# Patient Record
Sex: Male | Born: 1965 | Race: White | Hispanic: No | Marital: Single
Health system: Southern US, Community
[De-identification: ages and names within clinical notes are randomized; demographics above are authoritative.]

---

## 2007-05-14 ENCOUNTER — Other Ambulatory Visit: Payer: Self-pay

## 2007-05-14 ENCOUNTER — Emergency Department: Payer: Self-pay | Admitting: Emergency Medicine

## 2009-03-23 IMAGING — CR DG CHEST 2V
1 series · 2 of 2 positions shown · non-contrast
Comparison: none

REASON FOR EXAM: difficulty breathing
COMMENTS:

PROCEDURE:     DXR - DXR CHEST PA (OR AP) AND LATERAL  - May 14, 2007  [DATE]
RESULT:     The lung fields are clear. The heart, mediastinal and osseous
structures show no significant abnormalities.

[Series 1: view not recorded · 0.17mm/px · 2 of 2 slices shown]
[im 1/2]
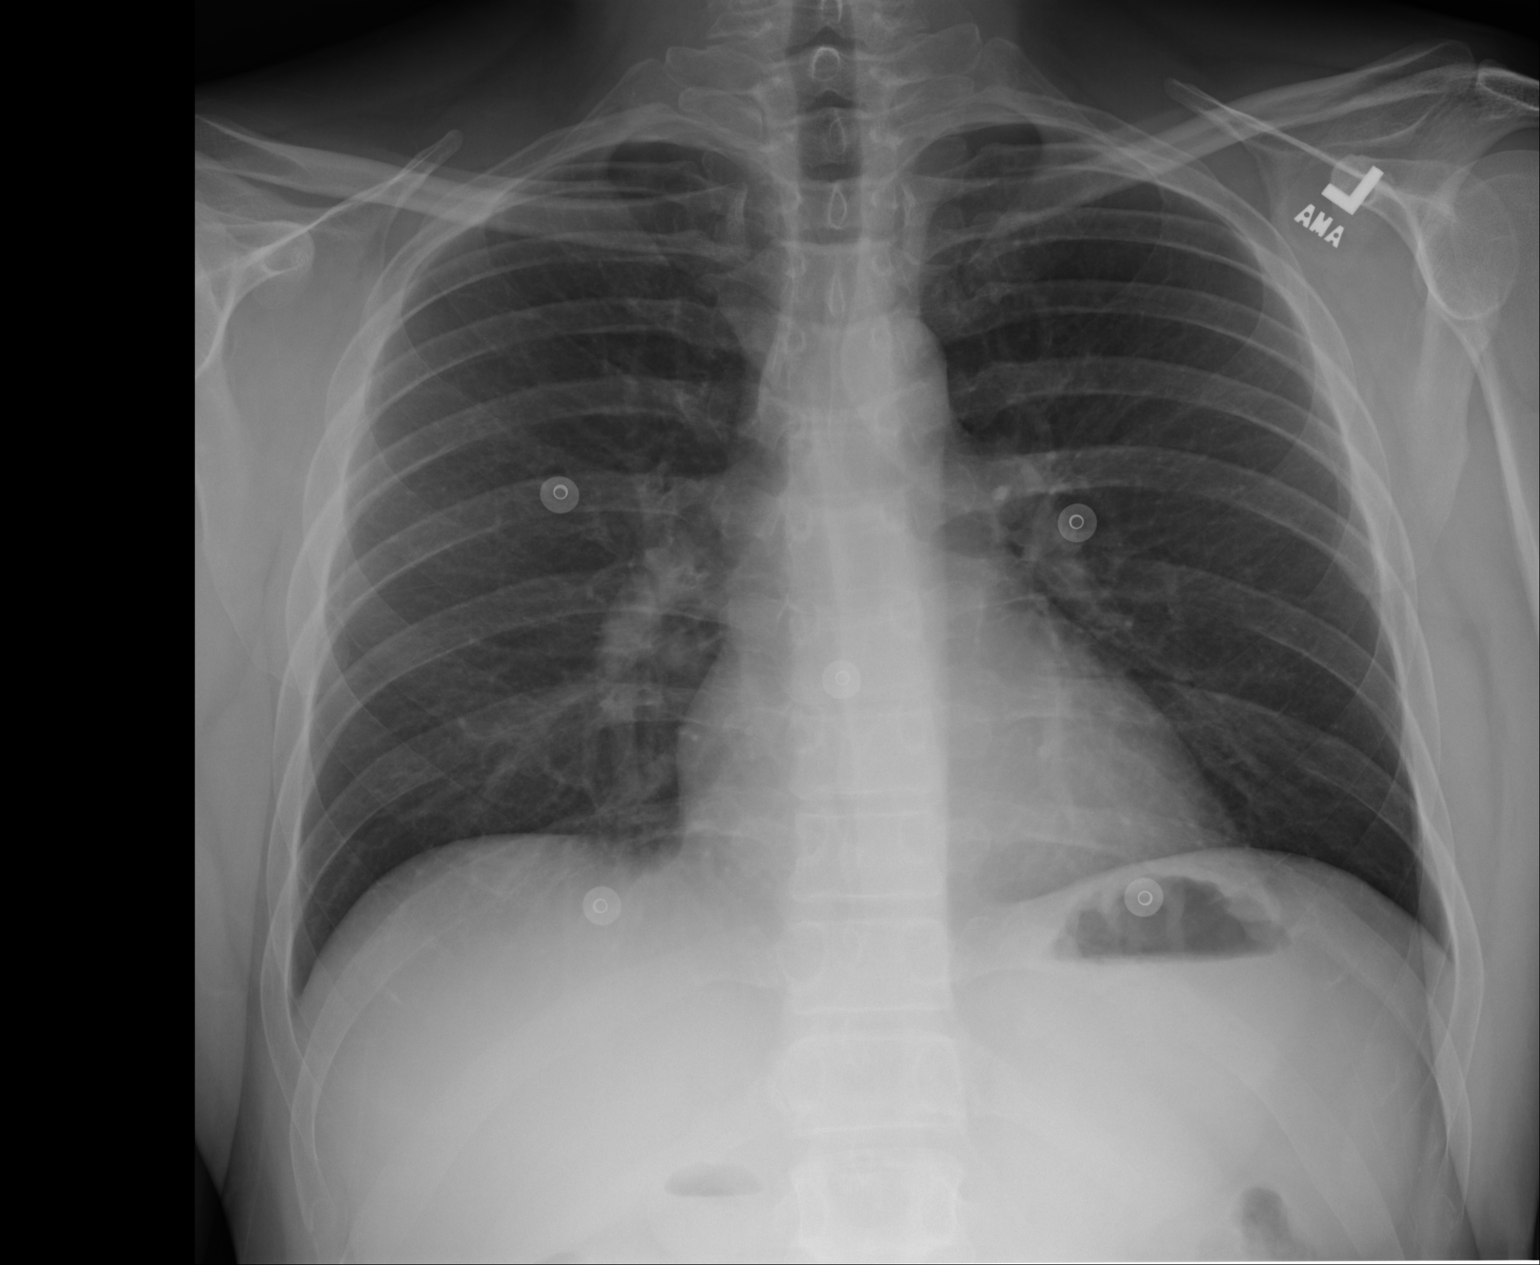
[im 2/2]
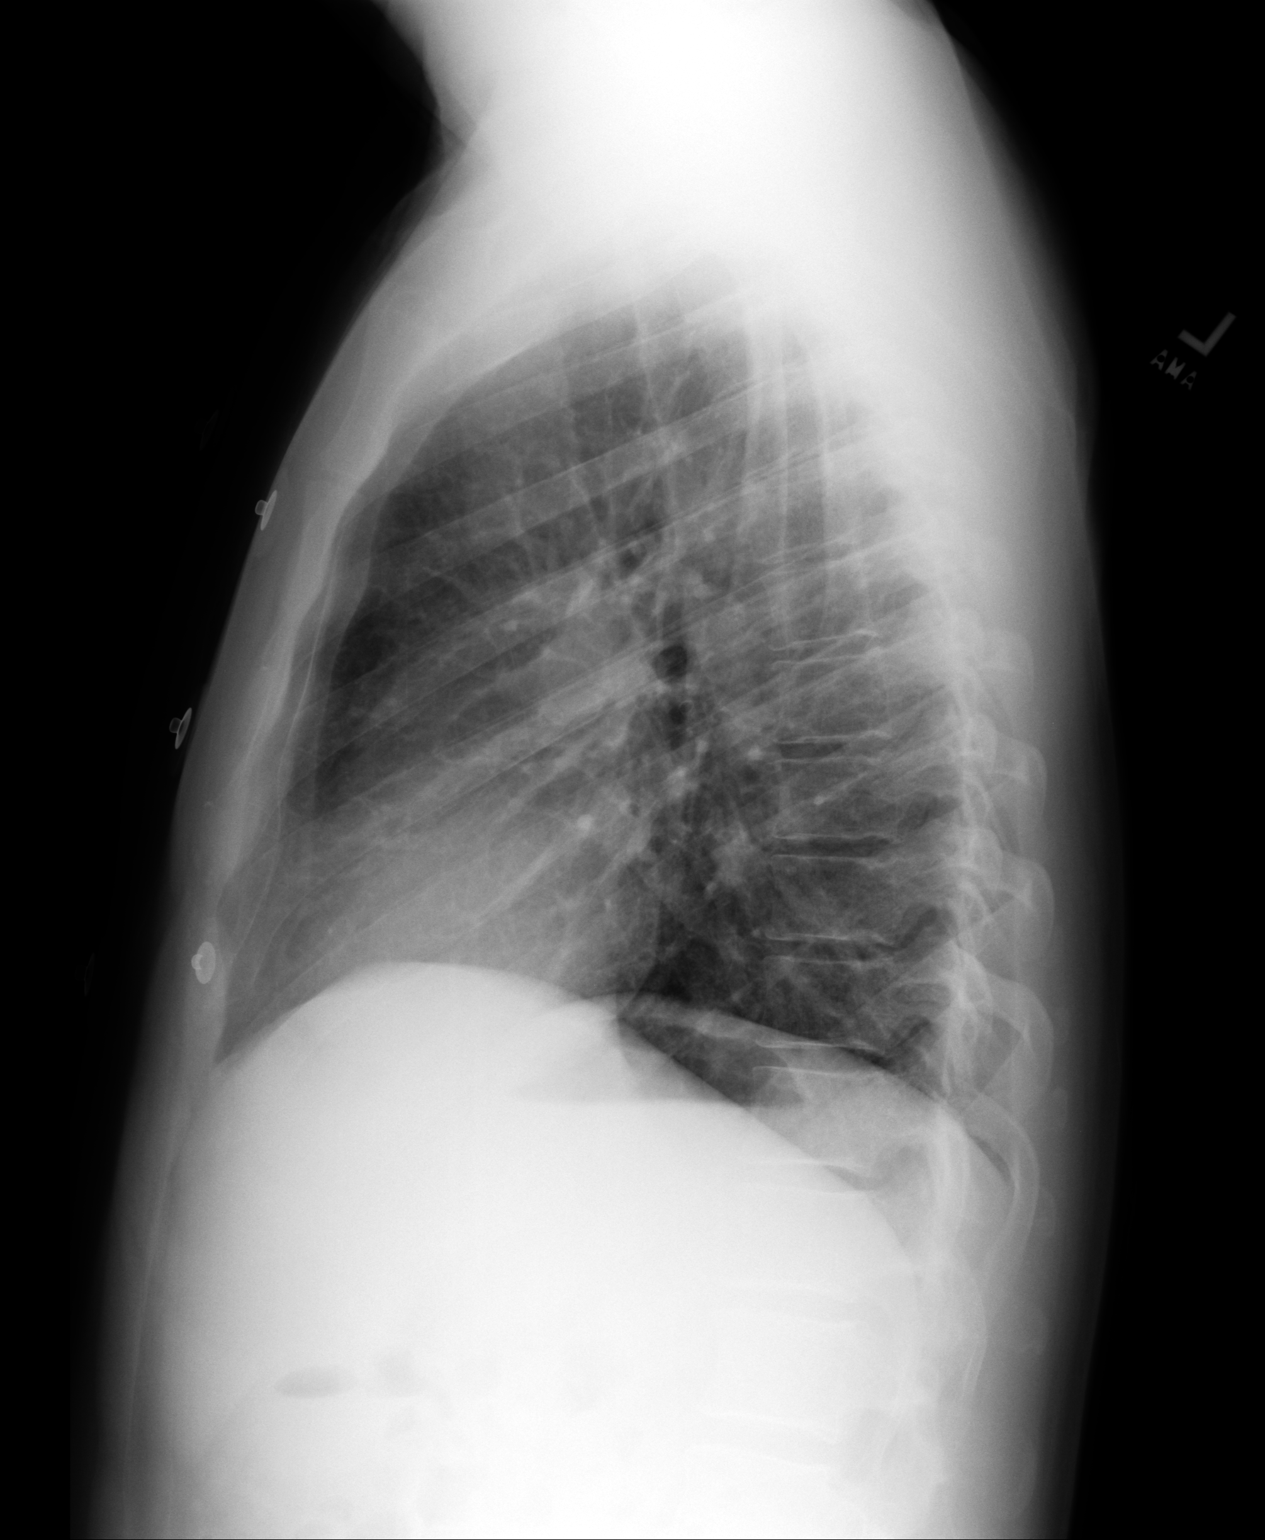

[2 of 2 positions shown; findings below may reference images not displayed]

IMPRESSION: 1.     No significant abnormalities are noted.

## 2019-11-05 ENCOUNTER — Other Ambulatory Visit: Payer: Self-pay

## 2019-11-05 ENCOUNTER — Ambulatory Visit: Payer: Self-pay | Attending: Oncology

## 2019-11-05 DIAGNOSIS — Z23 Encounter for immunization: Secondary | ICD-10-CM

## 2019-11-05 NOTE — Progress Notes (Signed)
   Covid-19 Vaccination Clinic  Name:  Travis Mcclain    MRN: [id number] DOB: 18-Aug-1965  11/05/2019  [name] was observed post Covid-19 immunization for 15 minutes without incident. He was provided with Vaccine Information Sheet and instruction to access the V-Safe system.   [name] was instructed to call 911 with any severe reactions post vaccine: Marland Kitchen Difficulty breathing  . Swelling of face and throat  . A fast heartbeat  . A bad rash all over body  . Dizziness and weakness   Immunizations Administered    Name Date Dose VIS Date Route   Pfizer COVID-19 Vaccine 11/05/2019 11:20 AM 0.3 mL 06/30/2018 Intramuscular   Manufacturer: [employer], [employer]   Lot: [id number]   NDC: [address]-2

## 2019-11-27 ENCOUNTER — Ambulatory Visit: Payer: Self-pay | Attending: Internal Medicine

## 2019-11-27 DIAGNOSIS — Z23 Encounter for immunization: Secondary | ICD-10-CM

## 2019-11-27 NOTE — Progress Notes (Signed)
   Covid-19 Vaccination Clinic  Name:  OWENS HARA    MRN: [id number] DOB: 04-26-1966  11/27/2019  Mr. Creveling was observed post Covid-19 immunization for 15 minutes without incident. He was provided with Vaccine Information Sheet and instruction to access the V-Safe system.   Mr. Yandell was instructed to call 911 with any severe reactions post vaccine: Marland Kitchen Difficulty breathing  . Swelling of face and throat  . A fast heartbeat  . A bad rash all over body  . Dizziness and weakness   Immunizations Administered    Name Date Dose VIS Date Route   Pfizer COVID-19 Vaccine 11/27/2019 11:13 AM 0.3 mL 06/30/2018 Intramuscular   Manufacturer: [employer], [employer]   Lot: [id number]   NDC: [address]-5

## 2023-07-31 ENCOUNTER — Ambulatory Visit: Payer: Self-pay

## 2023-08-04 ENCOUNTER — Ambulatory Visit: Payer: Self-pay | Admitting: Family Medicine

## 2023-08-04 DIAGNOSIS — A530 Latent syphilis, unspecified as early or late: Secondary | ICD-10-CM

## 2023-08-04 DIAGNOSIS — Z113 Encounter for screening for infections with a predominantly sexual mode of transmission: Secondary | ICD-10-CM

## 2023-08-04 LAB — HM HIV SCREENING LAB: HM HIV Screening: NEGATIVE

## 2023-08-04 NOTE — Progress Notes (Signed)
 [facility] Department STI clinic [address], [address] [place] [address] Main phone: [phone]  STI screening visit  Subjective:  Travis Mcclain is a 58 y.o. male being seen today for an STI screening visit. The patient reports they do not have symptoms.    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 No prior RPR testing on file in Epic chart or Care Everywhere. Six weeks ago, had one time intercourse with new partner.   Quit smoking 6 months ago.   STI screening history: Last HIV test per patient/review of record was No results found for: "HMHIVSCREEN" No results found for: "HIV"  Last HEPC test per patient/review of record was No results found for: "HMHEPCSCREEN" No components found for: "HEPC"   Last HEPB test per patient/review of record was No components found for: "HMHEPBSCREEN"   Fertility: Does the patient or their partner desires a pregnancy in the next year? No  Screening for MPX risk: Does the patient have an unexplained rash? Yes Is the patient MSM? No Does the patient endorse multiple sex partners or anonymous sex partners? Yes Did the patient have close or sexual contact with a person diagnosed with MPX? No Has the patient traveled outside the [place] where MPX is endemic? No Is there a high clinical suspicion for MPX-- evidenced by one of the following No  -Unlikely to be chickenpox  -Lymphadenopathy  -Rash that present in same phase of evolution on any given body part  See flowsheet for further details and programmatic requirements.   Immunization History  Administered Date(s) Administered   PFIZER(Purple Top)SARS-COV-2 Vaccination 11/05/2019, 11/27/2019    The following portions of the  patient's history were reviewed and updated as appropriate: allergies, current medications, past medical history, past social history, past surgical history and problem list.  Objective:  There were no vitals filed for this visit.  Physical Exam Vitals and nursing note reviewed.  Constitutional:      Appearance: Normal appearance.  HENT:     Head: Normocephalic and atraumatic.     Nose: Nose normal.     Mouth/Throat:     Mouth: Mucous membranes are moist.     Pharynx: Oropharynx is clear. No oropharyngeal exudate or posterior oropharyngeal erythema.  Eyes:     General: No scleral icterus.       Right eye: No discharge.        Left eye: No discharge.     Conjunctiva/sclera: Conjunctivae normal.     Right eye: Right conjunctiva is not injected. No exudate.    Left eye: Left conjunctiva is not injected. No exudate. Pulmonary:     Effort: Pulmonary effort is normal.  Abdominal:     Palpations: There is no hepatomegaly.  Genitourinary:    Comments: Declined genital exam- asymptomatic Musculoskeletal:        General: Normal range of motion.     Cervical back: Neck supple. No rigidity or tenderness.  Lymphadenopathy:     Cervical: No cervical adenopathy.     Upper Body:     Right upper body: No supraclavicular or axillary adenopathy.     Left upper body: No supraclavicular or axillary adenopathy.  Skin:    General: Skin is warm and dry.  Capillary Refill: Capillary refill takes less than 2 seconds.     Coloration: Skin is not jaundiced.     Findings: No bruising, lesion or rash.  Neurological:     Mental Status: He is alert and oriented to person, place, and time.  Psychiatric:        Mood and Affect: Mood normal.        Behavior: Behavior normal.    Assessment and Plan:  Travis Mcclain is a 58 y.o. male presenting to the [facility] Department for STI screening  Positive RPR test Assessment & Plan: Positive screening RPR at plasma donation center. Patient  reports he suspects that if positive, he acquired syphilis from a sexual encounter with new partner about 6 weeks ago. No prior RPR testing on file. No s/s RPR including chancre or rash. 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 Discussed this with patient and need for his partner to get contact treatment with bicillin x1 if he is indeed positive. Recommend no intercourse until results come back negative; or, if positive until 7 days after completion of full treatment course. Patient brings many great questions to clinic today, all questions answered.   Orders: -     Syphilis Serology, [facility]  Screening examination for venereal disease -     Syphilis Serology, [facility] -     Gonococcus culture -     HIV  LAB -     Chlamydia/GC NAA, Confirmation  Patient does not have STI symptoms Patient accepted all screenings including  urine GC/Chlamydia, and blood work for HIV/Syphilis. Patient meets criteria for HepB screening? No. Ordered? not applicable Patient meets criteria for HepC screening? No. Ordered? not applicable Recommended condom use with all sex Discussed importance of condom use for STI prevention  Treat positive test results per standing order. Discussed time line for [facility] results and that patient will be called with positive results and encouraged patient to call if he had not heard in 2 weeks Recommended repeat testing in 3 months with positive results. Recommended returning for continued or worsening symptoms.   No follow-ups on file.  No future appointments.  Clydene Fake, MD

## 2023-08-04 NOTE — Assessment & Plan Note (Addendum)
 Positive screening RPR at plasma donation center. Patient reports he suspects that if positive, he acquired syphilis from a sexual encounter with new partner about 6 weeks ago. No prior RPR testing on file. No s/s RPR including chancre or rash. 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 Discussed this with patient and need for his partner to get contact treatment with bicillin x1 if he is indeed positive. Recommend no intercourse until results come back negative; or, if positive until 7 days after completion of full treatment course. Patient brings many great questions to clinic today, all questions answered.

## 2023-08-04 NOTE — Patient Instructions (Signed)
 STI screening - Today we obtained a vaginal swab to screen for gonorrhea, chlamydia, and trichomonas - We also obtained a blood sample to screen for HIV and syphilis - If the results are normal, I will send you a letter or MyChart message. If the results are abnormal, I will give you a call.    Estimated time frame for results collected at the [facility] Department: Same day Trichomonas Yeast BV (bacterial vaginosis)  Within 1-2 weeks Gonorrhea Chlamydia  Within 2-3 weeks HIV Syphilis Hepatitis B Hepatitis C

## 2023-08-04 NOTE — Progress Notes (Signed)
 Pt here for STI screening and reactive syphilis at plasma center.  Labs drawn for confirmatory testing.  Pt will return for treatment once lab results return.-Tom Ragsdale, RN

## 2023-08-06 LAB — CHLAMYDIA/GC NAA, CONFIRMATION
Chlamydia trachomatis, NAA: NEGATIVE
Neisseria gonorrhoeae, NAA: NEGATIVE

## 2023-08-10 LAB — GONOCOCCUS CULTURE
